# Patient Record
Sex: Female | Born: 1990 | Race: Black or African American | Hispanic: No | Marital: Single | Smoking: Light tobacco smoker
Health system: Southern US, Community
[De-identification: ages and names within clinical notes are randomized; demographics above are authoritative.]

## PROBLEM LIST (undated history)

## (undated) DIAGNOSIS — I10 Essential (primary) hypertension: Secondary | ICD-10-CM

---
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
## 2009-01-30 ENCOUNTER — Inpatient Hospital Stay (HOSPITAL_COMMUNITY): Admission: AD | Admit: 2009-01-30 | Discharge: 2009-02-01 | Payer: Self-pay | Admitting: Obstetrics and Gynecology

## 2009-07-15 ENCOUNTER — Emergency Department (HOSPITAL_COMMUNITY): Admission: EM | Admit: 2009-07-15 | Discharge: 2009-07-15 | Payer: Self-pay | Admitting: Family Medicine

## 2010-01-17 ENCOUNTER — Emergency Department (HOSPITAL_COMMUNITY): Admission: EM | Admit: 2010-01-17 | Discharge: 2010-01-17 | Payer: Self-pay | Admitting: Family Medicine

## 2010-03-23 ENCOUNTER — Emergency Department (HOSPITAL_COMMUNITY): Admission: EM | Admit: 2010-03-23 | Discharge: 2010-03-23 | Payer: Self-pay | Admitting: Emergency Medicine

## 2010-06-25 ENCOUNTER — Emergency Department (HOSPITAL_COMMUNITY)
Admission: EM | Admit: 2010-06-25 | Discharge: 2010-06-25 | Payer: Self-pay | Source: Home / Self Care | Admitting: Emergency Medicine

## 2010-08-11 LAB — URINALYSIS, ROUTINE W REFLEX MICROSCOPIC
Bilirubin Urine: NEGATIVE
Glucose, UA: NEGATIVE mg/dL
Hgb urine dipstick: NEGATIVE
Specific Gravity, Urine: 1.033 — ABNORMAL HIGH (ref 1.005–1.030)
Urobilinogen, UA: 1 mg/dL (ref 0.0–1.0)
pH: 8 (ref 5.0–8.0)

## 2010-08-11 LAB — URINE MICROSCOPIC-ADD ON

## 2010-08-11 LAB — URINE CULTURE: Colony Count: NO GROWTH

## 2010-08-11 LAB — POCT PREGNANCY, URINE: Preg Test, Ur: NEGATIVE

## 2010-08-13 LAB — POCT PREGNANCY, URINE
Preg Test, Ur: POSITIVE
Preg Test, Ur: POSITIVE

## 2010-09-03 LAB — CBC
HCT: 35.9 % — ABNORMAL LOW (ref 36.0–46.0)
MCHC: 33.5 g/dL (ref 30.0–36.0)
Platelets: 199 10*3/uL (ref 150–400)
RBC: 3.84 MIL/uL — ABNORMAL LOW (ref 3.87–5.11)
RBC: 4.14 MIL/uL (ref 3.87–5.11)
WBC: 11.3 10*3/uL — ABNORMAL HIGH (ref 4.0–10.5)

## 2010-09-03 LAB — RPR: RPR Ser Ql: NONREACTIVE

## 2010-09-09 LAB — URINALYSIS, ROUTINE W REFLEX MICROSCOPIC
Nitrite: NEGATIVE
Specific Gravity, Urine: 1.02 (ref 1.005–1.030)
pH: 7.5 (ref 5.0–8.0)

## 2010-09-09 LAB — URINE MICROSCOPIC-ADD ON

## 2010-09-23 ENCOUNTER — Emergency Department (HOSPITAL_COMMUNITY)
Admission: EM | Admit: 2010-09-23 | Discharge: 2010-09-23 | Disposition: A | Discharge status: Home / Self Care | Payer: Self-pay | Attending: Emergency Medicine | Admitting: Emergency Medicine

## 2010-09-23 ENCOUNTER — Emergency Department (HOSPITAL_COMMUNITY): Payer: Self-pay

## 2010-09-23 DIAGNOSIS — M79609 Pain in unspecified limb: Secondary | ICD-10-CM | POA: Insufficient documentation

## 2010-09-23 DIAGNOSIS — S6390XA Sprain of unspecified part of unspecified wrist and hand, initial encounter: Secondary | ICD-10-CM | POA: Insufficient documentation

## 2010-11-12 ENCOUNTER — Inpatient Hospital Stay (INDEPENDENT_AMBULATORY_CARE_PROVIDER_SITE_OTHER)
Admission: RE | Admit: 2010-11-12 | Discharge: 2010-11-12 | Disposition: A | Discharge status: Home / Self Care | Payer: Self-pay | Source: Ambulatory Visit | Attending: Family Medicine | Admitting: Family Medicine

## 2010-11-12 DIAGNOSIS — H109 Unspecified conjunctivitis: Secondary | ICD-10-CM

## 2010-11-12 DIAGNOSIS — N912 Amenorrhea, unspecified: Secondary | ICD-10-CM

## 2010-12-02 ENCOUNTER — Emergency Department (HOSPITAL_COMMUNITY)
Admission: EM | Admit: 2010-12-02 | Discharge: 2010-12-02 | Disposition: A | Discharge status: Home / Self Care | Payer: Self-pay | Attending: Emergency Medicine | Admitting: Emergency Medicine

## 2010-12-02 DIAGNOSIS — M79609 Pain in unspecified limb: Secondary | ICD-10-CM | POA: Insufficient documentation

## 2010-12-07 ENCOUNTER — Emergency Department (HOSPITAL_COMMUNITY)
Admission: EM | Admit: 2010-12-07 | Discharge: 2010-12-07 | Disposition: A | Discharge status: Home / Self Care | Payer: Self-pay | Attending: Emergency Medicine | Admitting: Emergency Medicine

## 2010-12-07 DIAGNOSIS — J343 Hypertrophy of nasal turbinates: Secondary | ICD-10-CM | POA: Insufficient documentation

## 2010-12-07 DIAGNOSIS — J309 Allergic rhinitis, unspecified: Secondary | ICD-10-CM | POA: Insufficient documentation

## 2010-12-07 DIAGNOSIS — R040 Epistaxis: Secondary | ICD-10-CM | POA: Insufficient documentation

## 2010-12-30 ENCOUNTER — Emergency Department (HOSPITAL_COMMUNITY)
Admission: EM | Admit: 2010-12-30 | Discharge: 2010-12-31 | Discharge status: Against Medical Advice | Payer: Self-pay | Attending: Emergency Medicine | Admitting: Emergency Medicine

## 2010-12-30 DIAGNOSIS — R509 Fever, unspecified: Secondary | ICD-10-CM | POA: Insufficient documentation

## 2010-12-30 DIAGNOSIS — J029 Acute pharyngitis, unspecified: Secondary | ICD-10-CM | POA: Insufficient documentation

## 2010-12-31 ENCOUNTER — Emergency Department (HOSPITAL_COMMUNITY)
Admission: EM | Admit: 2010-12-31 | Discharge: 2010-12-31 | Disposition: A | Discharge status: Home / Self Care | Payer: Self-pay | Attending: Emergency Medicine | Admitting: Emergency Medicine

## 2010-12-31 DIAGNOSIS — R05 Cough: Secondary | ICD-10-CM | POA: Insufficient documentation

## 2010-12-31 DIAGNOSIS — R509 Fever, unspecified: Secondary | ICD-10-CM | POA: Insufficient documentation

## 2010-12-31 DIAGNOSIS — J3489 Other specified disorders of nose and nasal sinuses: Secondary | ICD-10-CM | POA: Insufficient documentation

## 2010-12-31 DIAGNOSIS — J029 Acute pharyngitis, unspecified: Secondary | ICD-10-CM | POA: Insufficient documentation

## 2010-12-31 DIAGNOSIS — R059 Cough, unspecified: Secondary | ICD-10-CM | POA: Insufficient documentation

## 2011-02-18 LAB — BASIC METABOLIC PANEL
BUN: 15
CO2: 26
Chloride: 100
Creatinine, Ser: 0.8
Glucose, Bld: 107 — ABNORMAL HIGH

## 2011-05-05 ENCOUNTER — Emergency Department (HOSPITAL_COMMUNITY): Payer: Self-pay

## 2011-05-05 ENCOUNTER — Encounter: Payer: Self-pay | Admitting: Emergency Medicine

## 2011-05-05 ENCOUNTER — Emergency Department (HOSPITAL_COMMUNITY)
Admission: EM | Admit: 2011-05-05 | Discharge: 2011-05-06 | Disposition: A | Discharge status: Home / Self Care | Payer: Self-pay | Attending: Emergency Medicine | Admitting: Emergency Medicine

## 2011-05-05 DIAGNOSIS — IMO0001 Reserved for inherently not codable concepts without codable children: Secondary | ICD-10-CM | POA: Insufficient documentation

## 2011-05-05 DIAGNOSIS — R059 Cough, unspecified: Secondary | ICD-10-CM | POA: Insufficient documentation

## 2011-05-05 DIAGNOSIS — R05 Cough: Secondary | ICD-10-CM | POA: Insufficient documentation

## 2011-05-05 DIAGNOSIS — R509 Fever, unspecified: Secondary | ICD-10-CM | POA: Insufficient documentation

## 2011-05-05 DIAGNOSIS — R51 Headache: Secondary | ICD-10-CM | POA: Insufficient documentation

## 2011-05-05 DIAGNOSIS — I10 Essential (primary) hypertension: Secondary | ICD-10-CM | POA: Insufficient documentation

## 2011-05-05 DIAGNOSIS — J111 Influenza due to unidentified influenza virus with other respiratory manifestations: Secondary | ICD-10-CM | POA: Insufficient documentation

## 2011-05-05 HISTORY — DX: Essential (primary) hypertension: I10

## 2011-05-05 MED ORDER — OSELTAMIVIR PHOSPHATE 75 MG PO CAPS: 75.0000 mg | ORAL_CAPSULE | Freq: Two times a day (BID) | ORAL | Status: AC

## 2011-05-05 NOTE — ED Provider Notes (Signed)
History     CSN: [id number] Arrival date & time: 05/05/2011  8:46 PM   First MD Initiated Contact with Patient 05/05/11 2244      Chief Complaint  Patient presents with  . 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  She has not been immunized against flu.  This year.  She has a 2 day history of myalgias, cough, headache, mild sore throat.  She is taking over-the-counter ibuprofen with some relief of her discomfort  Patient is a 20 y.o. female presenting with cough. The history is provided by the patient.  Cough This is a new problem. The current episode started 2 days ago. The problem occurs constantly. The cough is non-productive. The maximum temperature recorded prior to her arrival was 101 to 101.9 F. Associated symptoms include chills, headaches and myalgias. Pertinent negatives include no shortness of breath. She has tried nothing for the symptoms.    Past Medical History  Diagnosis Date  . Hypertension     History reviewed. No pertinent past surgical history.  No family history on file.  History  Substance Use Topics  . Smoking status: Never Smoker   . Smokeless tobacco: Not on file  . Alcohol Use: No    OB History    Grav Para Term Preterm Abortions TAB SAB Ect Mult Living                  Review of Systems  Constitutional: Positive for chills.  Eyes: Negative.   Respiratory: Positive for cough. Negative for shortness of breath.   Cardiovascular: Negative.   Gastrointestinal: Negative.   Genitourinary: Negative.   Musculoskeletal: Positive for myalgias.  Skin: Negative.   Neurological: Positive for headaches.  Hematological: Negative.   Psychiatric/Behavioral: Negative.     Allergies  Review of patient's allergies indicates no known allergies.  Home Medications   Current Outpatient Rx  Name  Route Sig Dispense Refill  . OSELTAMIVIR PHOSPHATE 75 MG PO CAPS Oral Take 1 capsule (75 mg total) by mouth every 12 (twelve) hours. 10 capsule 0    LMP 04/27/2011  Physical Exam  Constitutional: She is oriented to person, place, and time.  Eyes: Pupils are equal, round, and reactive to light.  Neck: Normal range of motion.  Cardiovascular: Normal rate.   Pulmonary/Chest: Effort normal and breath sounds normal. She has no wheezes.  Abdominal: Soft.  Neurological: She is alert and oriented to person, place, and time.  Skin: Skin is warm and dry.    ED Course  Procedures (including critical care time)  Labs Reviewed - No data to display Dg Chest 2 View  05/05/2011  *RADIOLOGY REPORT*  Clinical Data: Cough and fever.  CHEST - 2 VIEW  Comparison: 09/30/2007.  Findings: The heart size and mediastinal contours are normal. The lungs are clear. There is no pleural effusion or pneumothorax. No acute osseous findings are identified.  IMPRESSION: No active cardiopulmonary process.  Original Report Authenticated By: Gerrianne Scale, M.D.     1. 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

## 2011-05-05 NOTE — ED Notes (Signed)
PT. REPORTS PRODUCTIVE COUGH WITH FEVER AND CHEST CONGESTION .

## 2011-05-06 NOTE — ED Provider Notes (Signed)
Medical screening examination/treatment/procedure(s) were performed by non-physician practitioner and as supervising physician I was immediately available for consultation/collaboration.  Hurman Horn, MD 05/06/11 1754

## 2012-04-24 IMAGING — CR DG CHEST 2V
2 series · 2 of 2 positions shown · non-contrast
Comparison: 09/30/2007.

CLINICAL DATA: Cough and fever.

CHEST - 2 VIEW

[w chest pa]
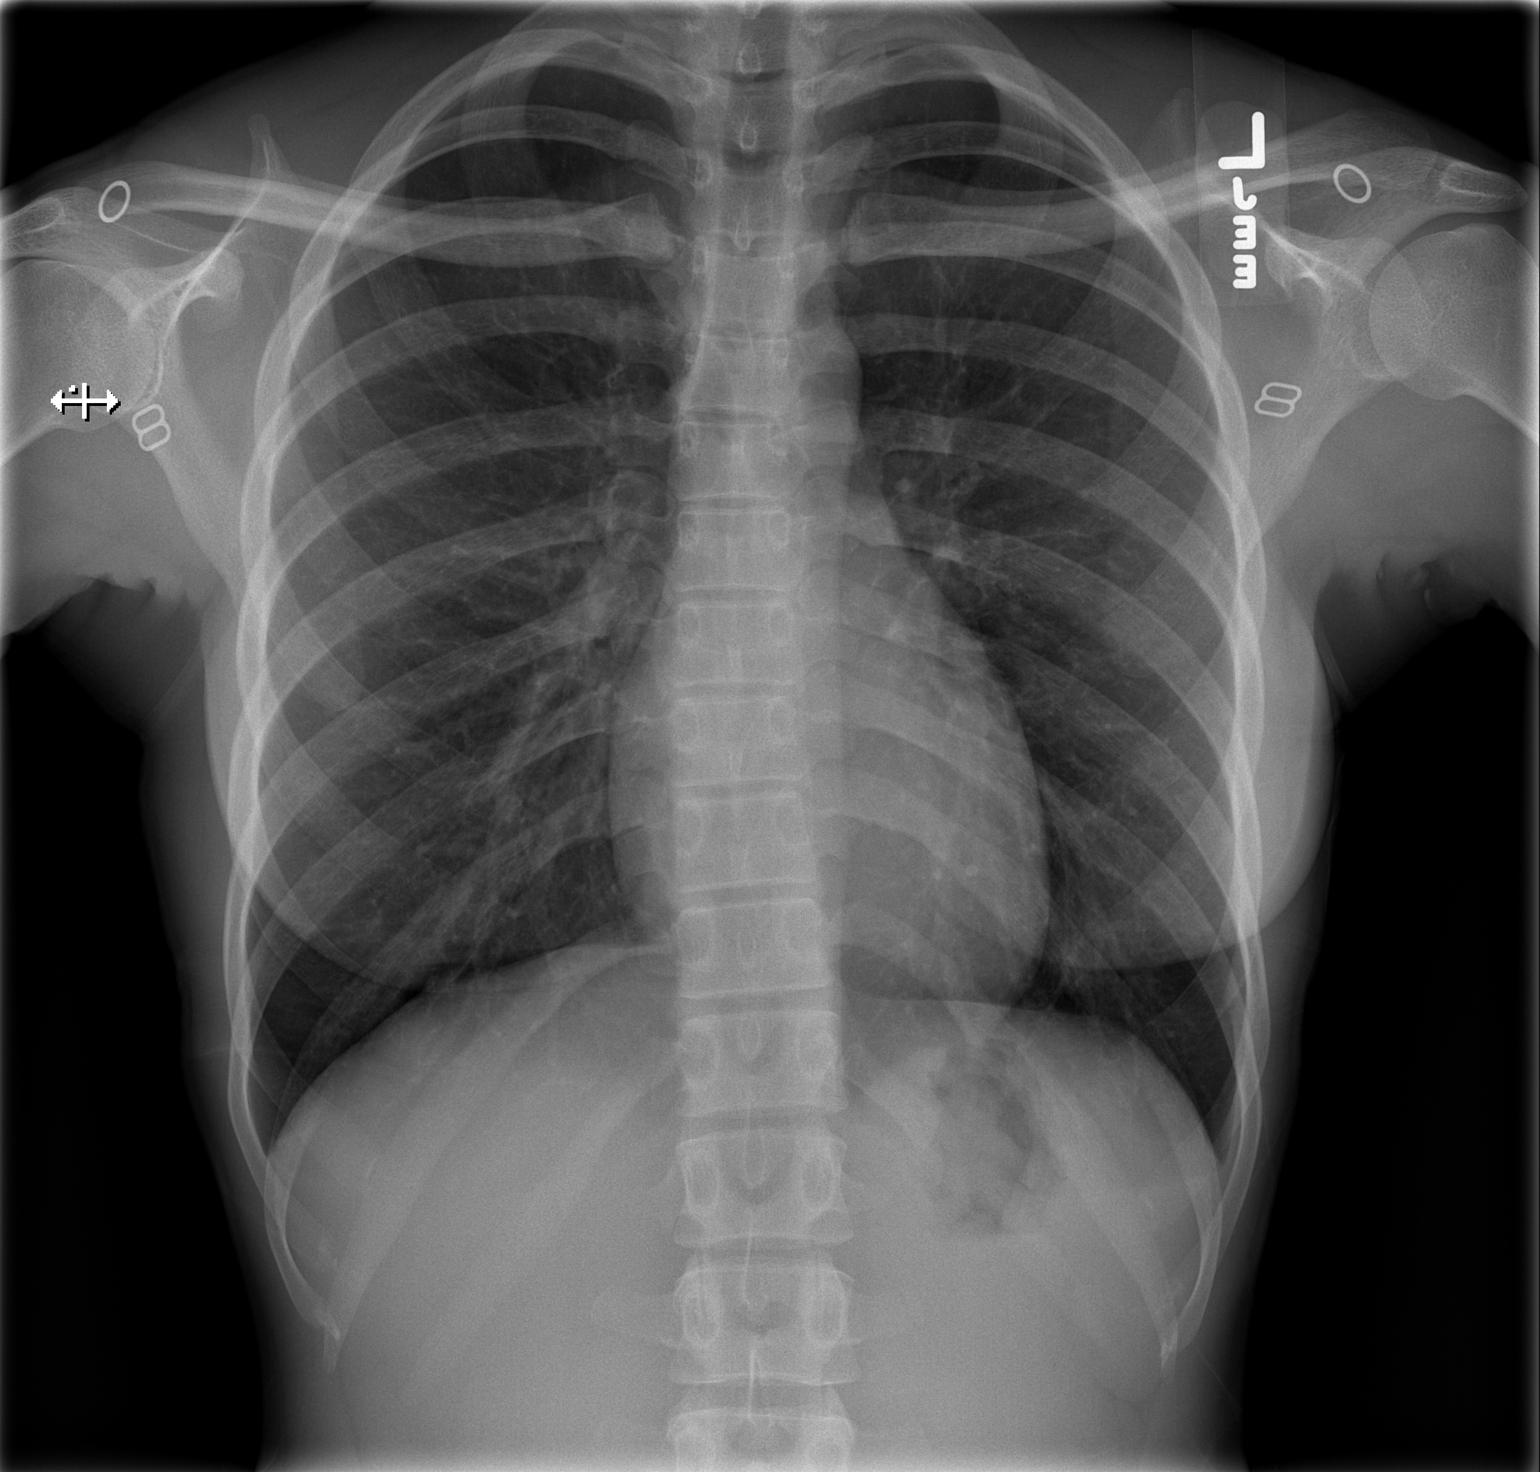

[w chest lat]
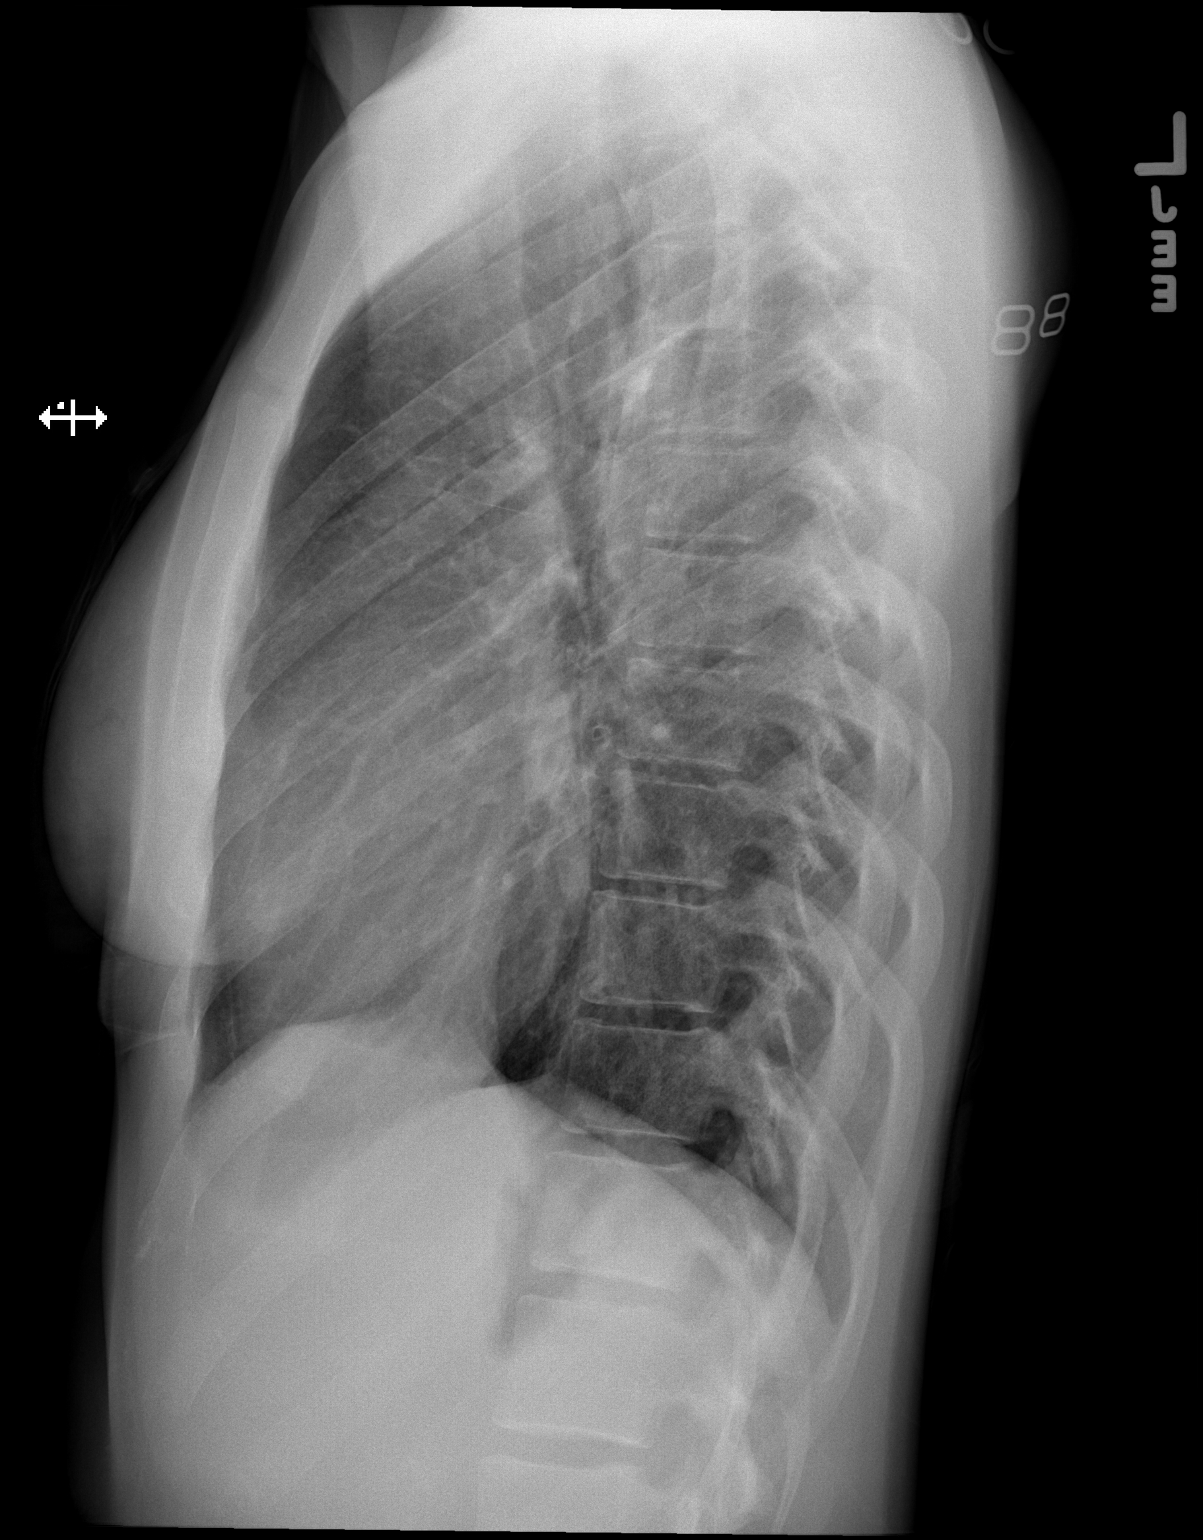

[2 of 2 positions shown; findings below may reference images not displayed]

FINDINGS: The heart size and mediastinal contours are normal. The
lungs are clear. There is no pleural effusion or pneumothorax. No
acute osseous findings are identified.
IMPRESSION: No active cardiopulmonary process.

## 2012-05-10 ENCOUNTER — Emergency Department (INDEPENDENT_AMBULATORY_CARE_PROVIDER_SITE_OTHER)
Admission: EM | Admit: 2012-05-10 | Discharge: 2012-05-10 | Disposition: A | Discharge status: Home / Self Care | Payer: Self-pay | Source: Home / Self Care | Attending: Emergency Medicine | Admitting: Emergency Medicine

## 2012-05-10 ENCOUNTER — Encounter (HOSPITAL_COMMUNITY): Payer: Self-pay | Admitting: *Deleted

## 2012-05-10 DIAGNOSIS — B9789 Other viral agents as the cause of diseases classified elsewhere: Secondary | ICD-10-CM

## 2012-05-10 DIAGNOSIS — B349 Viral infection, unspecified: Secondary | ICD-10-CM

## 2012-05-10 MED ORDER — IBUPROFEN 600 MG PO TABS: 600.0000 mg | ORAL_TABLET | Freq: Four times a day (QID) | ORAL | Status: DC | PRN

## 2012-05-10 NOTE — ED Provider Notes (Signed)
Medical screening examination/treatment/procedure(s) were performed by non-physician practitioner and as supervising physician I was immediately available for consultation/collaboration.  Lorilynn Lehr, M.D.   Johny Pitstick C Amariyana Heacox, MD 05/10/12 2249 
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
## 2012-05-10 NOTE — ED Provider Notes (Signed)
History     CSN: [id number]  Arrival date & time 05/10/12  1324   First MD Initiated Contact with Patient 05/10/12 1448      Chief Complaint  Patient presents with  . Sore Throat    (Consider location/radiation/quality/duration/timing/severity/associated sxs/prior treatment) Patient is a 21 y.o. female presenting with pharyngitis. The history is provided by the patient. No language interpreter was used.  Sore Throat This is a new problem. The current episode started yesterday. The problem occurs constantly. The problem has been gradually worsening. Pertinent negatives include no chest pain and no shortness of breath. Nothing aggravates the symptoms. Nothing relieves the symptoms. She has tried acetaminophen for the symptoms. The treatment provided mild relief.  Pt reports she has had a cough sorethroat and fever.   Pt reports her child had the same  Past Medical History  Diagnosis Date  . Hypertension     History reviewed. No pertinent past surgical history.  No family history on file.  History  Substance Use Topics  . Smoking status: Never Smoker   . Smokeless tobacco: Not on file  . Alcohol Use: No    OB History    Grav Para Term Preterm Abortions TAB SAB Ect Mult Living                  Review of Systems  HENT: Positive for sore throat.   Respiratory: Negative for shortness of breath.   Cardiovascular: Negative for chest pain.  All other systems reviewed and are negative.    Allergies  Review of patient's allergies indicates no known allergies.  Home Medications   Current Outpatient Rx  Name  Route  Sig  Dispense  Refill  . BENADRYL PO   Oral   Take by mouth.           BP 114/76  Pulse 103  Temp 98.9 F (37.2 C) (Oral)  Resp 16  SpO2 100%  Physical Exam  Nursing note and vitals reviewed. Constitutional: She appears well-developed and well-nourished.  HENT:  Head: Normocephalic and atraumatic.  Right Ear: External ear normal.  Left Ear:  External ear normal.  Nose: Nose normal.  Mouth/Throat: Oropharynx is clear and moist.  Eyes: Conjunctivae normal and EOM are normal. Pupils are equal, round, and reactive to light.  Neck: Normal range of motion. Neck supple.  Cardiovascular: Normal rate.   Pulmonary/Chest: Effort normal and breath sounds normal.  Abdominal: Soft. Bowel sounds are normal.  Musculoskeletal: Normal range of motion.  Neurological: She is alert.  Skin: Skin is warm.    ED Course  Procedures (including critical care time)  Labs Reviewed - No data to display No results found.   No diagnosis found.    MDM  I suspect viral illness,  I advised ibuprofen,  Encouraged fluids,  I advised return if symptoms worsen or change.       Lonia Skinner [place], [place] 05/10/12 1520

## 2013-05-15 ENCOUNTER — Emergency Department (HOSPITAL_BASED_OUTPATIENT_CLINIC_OR_DEPARTMENT_OTHER)
Admission: EM | Admit: 2013-05-15 | Discharge: 2013-05-15 | Disposition: A | Discharge status: Home / Self Care | Payer: Self-pay | Attending: Emergency Medicine | Admitting: Emergency Medicine

## 2013-05-15 ENCOUNTER — Encounter (HOSPITAL_BASED_OUTPATIENT_CLINIC_OR_DEPARTMENT_OTHER): Payer: Self-pay | Admitting: Emergency Medicine

## 2013-05-15 ENCOUNTER — Emergency Department (HOSPITAL_BASED_OUTPATIENT_CLINIC_OR_DEPARTMENT_OTHER): Payer: Self-pay

## 2013-05-15 DIAGNOSIS — F17200 Nicotine dependence, unspecified, uncomplicated: Secondary | ICD-10-CM | POA: Insufficient documentation

## 2013-05-15 DIAGNOSIS — R1013 Epigastric pain: Secondary | ICD-10-CM | POA: Insufficient documentation

## 2013-05-15 DIAGNOSIS — Z3202 Encounter for pregnancy test, result negative: Secondary | ICD-10-CM | POA: Insufficient documentation

## 2013-05-15 LAB — URINALYSIS, ROUTINE W REFLEX MICROSCOPIC
Bilirubin Urine: NEGATIVE
Glucose, UA: NEGATIVE mg/dL
Ketones, ur: 15 mg/dL — AB
Leukocytes, UA: NEGATIVE
Nitrite: NEGATIVE
Specific Gravity, Urine: 1.027 (ref 1.005–1.030)
Urobilinogen, UA: 1 mg/dL (ref 0.0–1.0)
pH: 8 (ref 5.0–8.0)

## 2013-05-15 LAB — COMPREHENSIVE METABOLIC PANEL
ALT: 13 U/L (ref 0–35)
AST: 17 U/L (ref 0–37)
Albumin: 4 g/dL (ref 3.5–5.2)
Alkaline Phosphatase: 54 U/L (ref 39–117)
BUN: 12 mg/dL (ref 6–23)
Chloride: 103 mEq/L (ref 96–112)
GFR calc Af Amer: >90 mL/min (ref >90)
Potassium: 3.7 mEq/L (ref 3.5–5.1)
Sodium: 141 mEq/L (ref 135–145)
Total Bilirubin: 0.2 mg/dL — ABNORMAL LOW (ref 0.3–1.2)
Total Protein: 6.9 g/dL (ref 6.0–8.3)

## 2013-05-15 LAB — CBC WITH DIFFERENTIAL/PLATELET
Basophils Absolute: 0 10*3/uL (ref 0.0–0.1)
Basophils Relative: 0 % (ref 0–1)
Eosinophils Absolute: 0.1 10*3/uL (ref 0.0–0.7)
Hemoglobin: 12.8 g/dL (ref 12.0–15.0)
MCH: 31.6 pg (ref 26.0–34.0)
MCHC: 34.2 g/dL (ref 30.0–36.0)
Monocytes Relative: 7 % (ref 3–12)
Neutro Abs: 3.2 10*3/uL (ref 1.7–7.7)
Neutrophils Relative %: 51 % (ref 43–77)
Platelets: 245 10*3/uL (ref 150–400)
RDW: 12.4 % (ref 11.5–15.5)

## 2013-05-15 LAB — LIPASE, BLOOD: Lipase: 30 U/L (ref 11–59)

## 2013-05-15 MED ORDER — GI COCKTAIL ~~LOC~~
30.0000 mL | Freq: Once | ORAL | Status: AC
  Administered 2013-05-15: 30 mL via ORAL
  Filled 2013-05-15: qty 30

## 2013-05-15 NOTE — ED Provider Notes (Signed)
CSN: [id number]     Arrival date & time 05/15/13  1441 History   First MD Initiated Contact with Patient 05/15/13 1450     Chief Complaint  Patient presents with  . Abdominal Pain   (Consider location/radiation/quality/duration/timing/severity/associated sxs/prior Treatment) HPI Comments: Patient is a 22 year old female otherwise healthy who presents with complaints of abdominal pain. She states that the symptoms began 4 years ago while she was pregnant and has been occurring intermittently since that time. She saw physician one year ago and had some tests perform and was told it was likely gastritis. She was started on Prilosec which she took for a month but did not notice much difference. The symptoms have been returning over the past few days.she denies fevers or chills. She denies any relation to food or drink. She does state that it seems to be worse at night. She denies any urinary complaints. Her last period was one month ago and was normal.   Patient is a 22 y.o. female presenting with abdominal pain. The history is provided by the patient.  Abdominal Pain Pain location:  Epigastric Pain quality: cramping   Pain radiates to:  Does not radiate Pain severity:  Moderate Onset quality:  Gradual Duration: 4 years. Timing:  Intermittent Progression:  Worsening   History reviewed. No pertinent past medical history. History reviewed. No pertinent past surgical history. No family history on file. History  Substance Use Topics  . Smoking status: Light Tobacco Smoker  . Smokeless tobacco: Not on file  . Alcohol Use: No   OB History   Grav Para Term Preterm Abortions TAB SAB Ect Mult Living                 Review of Systems  Gastrointestinal: Positive for abdominal pain.  All other systems reviewed and are negative.    Allergies  Review of patient's allergies indicates no known allergies.  Home Medications  No current outpatient prescriptions on file. BP 119/68  Pulse 69   Temp(Src) 98.4 F (36.9 C) (Oral)  Resp 16  SpO2 100%  LMP 04/23/2013 Physical Exam  Nursing note and vitals reviewed. Constitutional: She is oriented to person, place, and time. She appears well-developed and well-nourished. No distress.  HENT:  Head: Normocephalic and atraumatic.  Neck: Normal range of motion. Neck supple.  Cardiovascular: Normal rate and regular rhythm.  Exam reveals no gallop and no friction rub.   No murmur heard. Pulmonary/Chest: Effort normal and breath sounds normal. No respiratory distress. She has no wheezes.  Abdominal: Soft. Bowel sounds are normal. She exhibits no distension. There is tenderness.  There is mild tenderness to palpation in the epigastric region. There is no rebound and no guarding. Bowel sounds are present the  Musculoskeletal: Normal range of motion.  Neurological: She is alert and oriented to person, place, and time.  Skin: Skin is warm and dry. She is not diaphoretic.    ED Course  Procedures (including critical care time) Labs Review Labs Reviewed  CBC WITH DIFFERENTIAL  COMPREHENSIVE METABOLIC PANEL  LIPASE, BLOOD  URINALYSIS, ROUTINE W REFLEX MICROSCOPIC  PREGNANCY, URINE   Imaging Review No results found.    MDM  No diagnosis found. Patient is a 22 year old female presents with epigastric pain that she states has been intermittent for 4 years. Her laboratory studies are unremarkable an examination of her abdomen is benign. I suspect her symptoms either related to gastritis or acid reflux, or possibly gallbladder issues. My plans were to obtain an ultrasound  however there is not an ultrasound tech today to do this. She states that she is in a hurry to get her child from daycare he would like to return tomorrow to have this study done. I will make arrangements for this. In the meantime I will recommend that she take Prilosec 20 mg twice daily and will provide followup information for gastroenterology if her symptoms are not  explained by the ultrasound so that she can followup.    Geoffery Lyons, MD 05/15/13 [phone]

## 2013-05-15 NOTE — ED Notes (Signed)
Reports intermittent upper abdominal pain x one year.  Reoccurrence yesterday, here for evaluation d/t concern for ulcers.  Tried antacid without relief.

## 2013-05-16 ENCOUNTER — Ambulatory Visit (HOSPITAL_BASED_OUTPATIENT_CLINIC_OR_DEPARTMENT_OTHER): Admit: 2013-05-16 | Payer: Self-pay

## 2014-01-13 ENCOUNTER — Emergency Department (HOSPITAL_COMMUNITY)
Admission: EM | Admit: 2014-01-13 | Discharge: 2014-01-13 | Disposition: A | Discharge status: Home / Self Care | Payer: No Typology Code available for payment source | Attending: Emergency Medicine | Admitting: Emergency Medicine

## 2014-01-13 ENCOUNTER — Encounter (HOSPITAL_COMMUNITY): Payer: Self-pay | Admitting: Emergency Medicine

## 2014-01-13 ENCOUNTER — Emergency Department (HOSPITAL_COMMUNITY): Payer: No Typology Code available for payment source

## 2014-01-13 DIAGNOSIS — S161XXA Strain of muscle, fascia and tendon at neck level, initial encounter: Secondary | ICD-10-CM

## 2014-01-13 DIAGNOSIS — F17200 Nicotine dependence, unspecified, uncomplicated: Secondary | ICD-10-CM | POA: Insufficient documentation

## 2014-01-13 DIAGNOSIS — Z792 Long term (current) use of antibiotics: Secondary | ICD-10-CM | POA: Insufficient documentation

## 2014-01-13 DIAGNOSIS — S199XXA Unspecified injury of neck, initial encounter: Secondary | ICD-10-CM

## 2014-01-13 DIAGNOSIS — S0993XA Unspecified injury of face, initial encounter: Secondary | ICD-10-CM | POA: Insufficient documentation

## 2014-01-13 DIAGNOSIS — S39012A Strain of muscle, fascia and tendon of lower back, initial encounter: Secondary | ICD-10-CM

## 2014-01-13 DIAGNOSIS — Y92410 Unspecified street and highway as the place of occurrence of the external cause: Secondary | ICD-10-CM | POA: Insufficient documentation

## 2014-01-13 DIAGNOSIS — S139XXA Sprain of joints and ligaments of unspecified parts of neck, initial encounter: Secondary | ICD-10-CM | POA: Insufficient documentation

## 2014-01-13 DIAGNOSIS — Y9389 Activity, other specified: Secondary | ICD-10-CM | POA: Insufficient documentation

## 2014-01-13 DIAGNOSIS — S335XXA Sprain of ligaments of lumbar spine, initial encounter: Secondary | ICD-10-CM | POA: Insufficient documentation

## 2014-01-13 MED ORDER — HYDROCODONE-ACETAMINOPHEN 5-325 MG PO TABS: ORAL_TABLET | ORAL | Status: DC

## 2014-01-13 MED ORDER — HYDROCODONE-ACETAMINOPHEN 5-325 MG PO TABS
1.0000 | ORAL_TABLET | Freq: Once | ORAL | Status: AC
  Administered 2014-01-13: 1 via ORAL
  Filled 2014-01-13: qty 1
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
## 2014-01-13 NOTE — Discharge Instructions (Signed)
For pain control please take ibuprofen (also known as Motrin or Advil) 800mg  (this is normally 4 over the counter pills) 3 times a day  for 5 days. Take with food to minimize stomach irritation.  Take vicodin for breakthrough pain, do not drink alcohol, drive, care for children or do other critical tasks while taking vicodin.  Do not hesitate to return to the emergency room for any new, worsening or concerning symptoms.  Please obtain primary care using resource guide below. But the minute you were seen in the emergency room and that they will need to obtain records for further outpatient management.    Emergency Department Resource Guide 1) Find a Doctor and Pay Out of Pocket Although you won't have to find out who is covered by your insurance plan, it is a good idea to ask around and get recommendations. You will then need to call the office and see if the doctor you have chosen will accept you as a new patient and what types of options they offer for patients who are self-pay. Some doctors offer discounts or will set up payment plans for their patients who do not have insurance, but you will need to ask so you aren't surprised when you get to your appointment.  2) Contact Your Local Health Department Not all health departments have doctors that can see patients for sick visits, but many do, so it is worth a call to see if yours does. If you don't know where your local health department is, you can check in your phone book. The CDC also has a tool to help you locate your state's health department, and many state websites also have listings of all of their local health departments.  3) Find a Walk-in Clinic If your illness is not likely to be very severe or complicated, you may want to try a walk in clinic. These are popping up all over the country in pharmacies, drugstores, and shopping centers. They're usually staffed by nurse practitioners or physician assistants that have been trained to treat  common illnesses and complaints. They're usually fairly quick and inexpensive. However, if you have serious medical issues or chronic medical problems, these are probably not your best option.  No Primary Care Doctor: - Call Health Connect at  [phone] - they can help you locate a primary care doctor that  accepts your insurance, provides certain services, etc. - Physician Referral Service- [phone]  Chronic Pain Problems: Organization         Address  Phone   Notes  [facility]  [phone] Patients need to be referred by their primary care doctor.   Medication Assistance: Organization         Address  Phone   Notes  [facility] [address] [place], [place] [address] [phone] --Must be a resident of [facility] [place] -- Must have NO insurance coverage whatsoever (no Medicaid/ Medicare, etc.) -- The pt. MUST have a primary care doctor that directs their care regularly and follows them in the community   [facility]ist  [phone]   [employer]  [phone]    Agencies that provide inexpensive medical care: Organization         Address  Phone   Notes  [facility]  [phone]   [facility]    [phone]   [facility] [address] [place], [place] [address] [phone]  [facility] [address], [place] [phone]   [facility]anned [facility]    [phone]   [facility]    [phone]   [place] and [place] [address], [place] Phone:  [phone], Fax:  [phone] Hours of Operation:  9 am - 6 pm, M-F.  Also accepts Medicaid/Medicare and self-pay.  [facility] for [place], [address], [place] Phone: [phone], Fax: [phone]. Hours of Operation:  8:30 am - 5:30 pm, M-F.  Also accepts Medicaid and self-pay.  [facility] [place] [address] [place] Phone: [phone]   [place], [place], [place] [phone], Ext. [phone] Mondays & Thursdays: 7-9 AM.  First 15 patients are seen on a first come, first serve basis.    [place] Providers:  Organization         Address  Phone   Notes  [facility] [address], [place] [phone] Also accepts self-pay patients.  [facility] [address], [place]  [phone]   [place], [address], [place] [phone]   [facility] [address], [place] [phone]   Lucianne Lei [address], [place]   [phone] Only accepts [place] Access [place] patients after they have their name applied to their card.   Self-Pay (no insurance) in [facility]:  Organization         Address  Phone   Notes  Sickle Cell Patients, [facility] [place] [phone]   [facility] [place] [phone]   [facility] [place], [place], [place] [phone]   [facility]/Dr. Osei-Bonsu  [address] [place] or [address], [place] [phone] Phone number for both [place] and [place] locations is the same.  [facility] [address] [place] [phone]   [facility] [address], [place] or [address] [phone]   [facility] [address], [place] [phone], phone; [phone], fax Sees patients 1st and 3rd Saturday of every month.  Must not qualify for public or private insurance (i.e. Medicaid, Medicare, [place] Health Choice, Veterans' Benefits)  Household income should be no more than 200% of the  poverty level The clinic cannot treat you if you are pregnant or think you are pregnant  Sexually transmitted diseases are not treated at the clinic.    Dental Care: Organization         Address  Phone  Notes  [facility] Department of [facility] [place] [phone] Accepts children up to age 78 who are enrolled in [place] or [place]; pregnant women with a Medicaid card; and children who have applied for Medicaid or [place] Health Choice, but were declined, whose parents can pay a reduced fee at time of service.  [facility] Department of [facility]  [address], [place] [phone] Accepts children up to age 72 who are enrolled in [place] or [place]; pregnant women with a Medicaid card; and  children who have applied for Medicaid or [place] Health Choice, but were declined, whose parents can pay a reduced fee at time of service.  [place] [facility]  [place] [phone] Patients are seen by appointment only. Walk-ins are not accepted. [place] will see patients 49 years of age and older. Monday - Tuesday (8am-5pm) Most Wednesdays (8:30-5pm) $30 per visit, cash only  [facility] Adult Dental Access PROGRAM  [address], [facility] [phone] Patients are seen by appointment only. Walk-ins are not accepted. [place] will see patients 74 years of age and older. One Wednesday Evening (Monthly: Volunteer Based).  $30 per visit, cash only  [place]  [phone] for adults; Children under age 77, call Graduate Pediatric Dentistry at [phone]. Children aged 60-14, please call [phone] to request a pediatric application.  Dental services are provided in all areas of dental care including fillings, crowns and bridges, complete and partial dentures, implants, gum treatment, root canals, and extractions.  Preventive care is also provided. Treatment is provided to both adults and children. Patients are selected via a lottery and there is often a waiting list.   [facility] [address] [place]  [phone] [email or web address]   [facility] [address] [place], [place] [phone], Ext. [phone] Second and Fourth Thursday of each month, opens at 6:30 AM; Clinic ends at 9 AM.  Patients are seen on a first-come first-served basis, and a limited number are seen during each clinic.   [facility]  [address] Hillard Danker [place], [place] [phone]   Eligibility Requirements You must have lived in [place], [place], or [place] counties for at least the last three months.   You cannot be eligible for state or federal sponsored [employer], including [employer], [place], or [employer].   You generally cannot be eligible for healthcare insurance through your employer.    How to apply: Eligibility screenings are held every Tuesday and Wednesday afternoon from 1:00 pm until 4:00 pm. You do not need an appointment for the interview!  [facility] [address] [place], [place]   [place]  [place] Department  [place]  [phone]    Behavioral Health Resources in the Community: Intensive Outpatient Programs Organization         Address  Phone  Notes  [place] [address] [place], [place] [phone]   [facility] Outpatient [address] [place], [place]   ADS: [facility] [address], [place], [place]   [place] [address],  [place], [place] or [phone]   Substance Abuse Resources Organization         Address  Phone  Notes  [facility] Drug Services  [phone]   [place]  [phone]   [facility]   Chinita Pester  ([phone]   Residential & Outpatient Substance Abuse Program  [phone]   Psychological Services Organization         Address  Phone  Notes  [facility] [place]  [place]  [phone]   [place] [address] [place] or [phone]    Mobile Crisis Teams Organization  Address  Phone  Notes  °[facility], Mobile Crisis Care Unit  1-877-626-1772   °Assertive °Psychotherapeutic Services ° [address] [place], [place] [phone]   °Sharon DeEsch [address] °[place] [phone]   ° °Self-Help/Support Groups °Organization         Address  Phone             Notes  °[facility] [place] - variety of support groups  336- [phone] Call for more information  °Narcotics Anonymous (NA), [facility] [address], °[place]  2 meetings at this location  ° °Residential Treatment Programs °Organization         Address  Phone  Notes  °[facility] [address],    °[place]  1-866-801-8205   °[facility] ° [address], [place], [place] [phone]   °[facility] [address], [place] [phone] Admissions: 8am-3pm M-F  °[facility] [address]    °[place], [place] [phone]   °[facility] [address], [place], [place] [phone]   °[facility] [address]  °[place], [place] [phone]   °[facility] - Intensive Outpatient [address], [place], [place] [phone]   °[facility] [address]  °[place], [place] 1-877-615-2722 or [phone]   °[facility] ([facility]) [address] [place], [place] [phone] Accepts Medicaid  °[facility] [address]   °[place] 1-800-659-3381 Substance Abuse/Addiction Treatment  ° °[place] Behavioral Health Resources °Organization         Address  Phone  Notes  °[facility]  (888) [phone]   °Julie Brannon, PhD [address] [place], [place]   (336) [phone] or (336) 951-0000   °[facility] [place] [facility]   [address] °[place], [place] (336) [phone]   °[facility] [address], [place], [place] (336) [phone] Insurance/Medicaid/sponsorship through [facility]  °[facility] [address]                                    [place], [place] ([phone]) [phone] Therapy/tele-psych/case  °[facility] [address]  ° [place], [place] (336) [phone]    °Dr. Arfeen  ([phone]) [phone]   °[facility] [place]  [employer] [place] [facility] 1) [address],  °2) [address], [place] °3)  [address], [place] (336) [phone] °(336) [phone] ° °(336) [phone]   °[place] Child Abuse Hotline (336) 342-1394 or (336) [phone] (After Hours)    ° ° ° °

## 2014-01-13 NOTE — ED Provider Notes (Signed)
Medical screening examination/treatment/procedure(s) were performed by non-physician practitioner and as supervising physician I was immediately available for consultation/collaboration.   EKG Interpretation None       Raeford RazorStephen Dornell Grasmick, MD 01/13/14 2320
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
## 2014-12-09 ENCOUNTER — Encounter (HOSPITAL_COMMUNITY): Payer: Self-pay | Admitting: Emergency Medicine

## 2014-12-09 ENCOUNTER — Emergency Department (INDEPENDENT_AMBULATORY_CARE_PROVIDER_SITE_OTHER)
Admission: EM | Admit: 2014-12-09 | Discharge: 2014-12-09 | Disposition: A | Discharge status: Home / Self Care | Payer: Self-pay | Source: Home / Self Care | Attending: Family Medicine | Admitting: Family Medicine

## 2014-12-09 DIAGNOSIS — H6011 Cellulitis of right external ear: Secondary | ICD-10-CM

## 2014-12-09 MED ORDER — CEPHALEXIN 500 MG PO CAPS: 500.0000 mg | ORAL_CAPSULE | Freq: Four times a day (QID) | ORAL | Status: DC

## 2014-12-09 NOTE — ED Provider Notes (Signed)
CSN: [id number]     Arrival date & time 12/09/14  1451 History   First MD Initiated Contact with Patient 12/09/14 1537     No chief complaint on file.  (Consider location/radiation/quality/duration/timing/severity/associated sxs/prior Treatment) Patient is a 24 y.o. female presenting with ear pain. The history is provided by the patient.  Otalgia Location:  Right Behind ear:  Swelling Severity:  Mild Onset quality:  Gradual Duration:  2 days Chronicity:  New Context comment:  S/p piercings to right pinna , now with sts and pain to pinna superiorly, sl erythema, no drainage Relieved by:  None tried Worsened by:  Nothing tried Associated symptoms: rash     No past medical history on file. No past surgical history on file. No family history on file. History  Substance Use Topics  . Smoking status: Light Tobacco Smoker  . Smokeless tobacco: Not on file  . Alcohol Use: No   OB History    No data available     Review of Systems  Constitutional: Negative.   HENT: Positive for ear pain.   Skin: Positive for rash. Negative for pallor and wound.    Allergies  Review of patient's allergies indicates no known allergies.  Home Medications   Prior to Admission medications   Medication Sig Start Date End Date Taking? Authorizing Provider  amoxicillin (AMOXIL) 500 MG tablet Take 500 mg by mouth 2 (two) times daily.    Historical Provider, MD  cephALEXin (KEFLEX) 500 MG capsule Take 1 capsule (500 mg total) by mouth 4 (four) times daily. Take all of medicine and drink lots of fluids 12/09/14   Linna HoffJames D Clarann Helvey, MD  clarithromycin (BIAXIN) 250 MG tablet Take 250 mg by mouth 2 (two) times daily.    Historical Provider, MD  HYDROcodone-acetaminophen (NORCO/VICODIN) 5-325 MG per tablet Take 1-2 tablets by mouth every 6 hours as needed for pain. 01/13/14   Nicole Pisciotta, PA-C   BP 131/88 mmHg  Pulse 67  Temp(Src) 99.1 F (37.3 C) (Oral)  Resp 12  SpO2 98% Physical Exam    Constitutional: She appears well-developed and well-nourished. No distress.  HENT:  Head:    Left Ear: External ear normal.  Mouth/Throat: Oropharynx is clear and moist.  Nursing note and vitals reviewed.   ED Course  Procedures (including critical care time) Labs Review Labs Reviewed - No data to display  Imaging Review No results found.   MDM   1. Cellulitis of right pinna        Linna HoffJames D Mariaelena Cade, MD 12/09/14 1616

## 2014-12-09 NOTE — ED Notes (Signed)
Ear pain, onset yesterday, noted swelling

## 2014-12-09 NOTE — Discharge Instructions (Signed)
Warm compress 4 times a day when you take the antibiotic, take all of medicine, return as needed. °

## 2015-07-01 ENCOUNTER — Ambulatory Visit (INDEPENDENT_AMBULATORY_CARE_PROVIDER_SITE_OTHER): Payer: BLUE CROSS/BLUE SHIELD | Admitting: Family Medicine

## 2015-07-01 VITALS — BP 120/80 | HR 80 | Temp 98.5°F | Resp 20 | Ht 62.6 in | Wt 115.2 lb

## 2015-07-01 DIAGNOSIS — R1013 Epigastric pain: Secondary | ICD-10-CM | POA: Diagnosis not present

## 2015-07-01 MED ORDER — AMOXICILLIN 500 MG PO CAPS: 1000.0000 mg | ORAL_CAPSULE | Freq: Two times a day (BID) | ORAL | Status: AC

## 2015-07-01 MED ORDER — OMEPRAZOLE 20 MG PO CPDR: 20.0000 mg | DELAYED_RELEASE_CAPSULE | Freq: Two times a day (BID) | ORAL | Status: AC

## 2015-07-01 NOTE — Progress Notes (Signed)
   Subjective:    Patient ID: Rachael Meyer, female    DOB: [birth year], 25 y.o.   MRN: [id number] By signing my name below, I, Rachael Meyer, attest that this documentation has been prepared under the direction and in the presence of Elvina Sidle, MD.  Electronically Signed: Littie Meyer, Medical Scribe. 07/01/2015. 10:58 AM.  HPI HPI Comments: Rachael Meyer is a 25 y.o. female who presents to the [facility] complaining of gradual onset abdominal pain that started about a week ago. The pain is worse at night when she is working. The pain is improved for about 30 minutes after eating. She tried Pepto-Bismol and other OTC treatments but without relief. She states the pain is similar to when she was diagnosed with a H. Pylori infection. At that time, she was treated successfully with amoxicillin.  Patient works at [employer]. She has one child.  Review of Systems  Gastrointestinal: Positive for abdominal pain.       Objective:   Physical Exam CONSTITUTIONAL: Well developed/well nourished HEAD: Normocephalic/atraumatic EYES: EOM/PERRL ENMT: Mucous membranes moist NECK: supple no meningeal signs SPINE: entire spine nontender CV: S1/S2 noted, no murmurs/rubs/gallops noted LUNGS: Lungs are clear to auscultation bilaterally, no apparent distress ABDOMEN: Tender in the epigastrium with deep palpation. No rebound. No HSM. GU: no cva tenderness NEURO: Pt is awake/alert, moves all extremitiesx4 EXTREMITIES: pulses normal, full ROM SKIN: warm, color normal PSYCH: no abnormalities of mood noted        Assessment & Plan:   This chart was scribed in my presence and reviewed by me personally.    ICD-9-CM ICD-10-CM   1. Abdominal pain, epigastric 789.06 R10.13 H. pylori breath test     amoxicillin (AMOXIL) 500 MG capsule     omeprazole (PRILOSEC) 20 MG capsule     Signed, Elvina Sidle, MD

## 2015-07-01 NOTE — Patient Instructions (Signed)
We will call you in the next couple days for results of this test area and if it's positive, we will prescribe another medication go with the amoxicillin and Prilosec   Helicobacter Pylori Antibodies Test WHY AM I HAVING THIS TEST? This is a blood test that looks for bacteria called Helicobacter pylori (H. pylori). H. pylori is a germ that can be found in the cells that line the stomach. Having high levels of H. pylori in your stomach puts you at risk for stomach ulcers and small bowel ulcers, long-term (chronic) inflammation of the lining of the stomach, or even ulcers that may occur in the canal that runs from the mouth to the stomach (esophagus). Presence of H. pylori can also increase your risk for stomach cancer if it is left untreated. Most people with H. pylori in their stomach bacteria have no symptoms. Your health care provider may ask you to have this test if you have symptoms of a stomach ulcer or small bowel ulcer, such as stomach pain before or after eating, heartburn, or nausea repeatedly after eating. WHAT KIND OF SAMPLE IS TAKEN? A blood sample is required for this test. It is usually collected by inserting a needle into a vein or sticking a finger with a small needle. HOW DO I PREPARE FOR THE TEST? There is no preparation required for this test. WHAT ARE THE REFERENCE RANGES? Reference ranges are considered healthy ranges established after testing a large group of healthy people. Reference ranges may vary among different people, labs, and hospitals. It is your responsibility to obtain your test results. Ask the lab or department performing the test when and how you will get your results. Your test results will be reported as positive, negative, or equivocal. Equivocal means that your results are neither positive nor negative. References ranges for these results are as follows:  Less than or equal to 30 units/mL. This is negative.  Greater than or equal to 40 units/mL. This is  positive.  30.01-39.99 units/mL. This is equivocal. WHAT DO THE RESULTS MEAN? Test results that are higher than normal may indicate numerous health conditions. These may include:  Short-term or long-term irritation of the stomach lining (gastritis).  Small bowel ulcer.  Stomach ulcer.  Stomach cancer. Talk with your health care provider to discuss your results, treatment options, and if necessary, the need for more tests. Talk with your health care provider if you have any questions about your results.   This information is not intended to replace advice given to you by your health care provider. Make sure you discuss any questions you have with your health care provider.   Document Released: 06/09/2004 Document Revised: 06/06/2014 Document Reviewed: 09/27/2013 Elsevier Interactive Patient Education [employer]

## 2015-07-02 LAB — H. PYLORI BREATH TEST: H. pylori Breath Test: DETECTED — AB

## 2015-07-03 ENCOUNTER — Other Ambulatory Visit: Payer: Self-pay | Admitting: Family Medicine

## 2015-07-03 MED ORDER — METRONIDAZOLE 500 MG PO TABS
500.0000 mg | ORAL_TABLET | Freq: Two times a day (BID) | ORAL | Status: AC
Start: 2015-07-03 — End: ?

## 2023-03-14 ENCOUNTER — Encounter (HOSPITAL_BASED_OUTPATIENT_CLINIC_OR_DEPARTMENT_OTHER): Payer: Self-pay | Admitting: Pediatrics

## 2023-03-14 ENCOUNTER — Emergency Department (HOSPITAL_BASED_OUTPATIENT_CLINIC_OR_DEPARTMENT_OTHER)
Admission: EM | Admit: 2023-03-14 | Discharge: 2023-03-14 | Discharge status: Against Medical Advice | Payer: Medicaid Other | Source: Home / Self Care

## 2023-03-14 ENCOUNTER — Emergency Department (HOSPITAL_BASED_OUTPATIENT_CLINIC_OR_DEPARTMENT_OTHER)
Admission: EM | Admit: 2023-03-14 | Discharge: 2023-03-14 | Disposition: A | Discharge status: Home / Self Care | Payer: Medicaid Other | Attending: Emergency Medicine | Admitting: Emergency Medicine

## 2023-03-14 ENCOUNTER — Other Ambulatory Visit: Payer: Self-pay

## 2023-03-14 ENCOUNTER — Emergency Department (HOSPITAL_BASED_OUTPATIENT_CLINIC_OR_DEPARTMENT_OTHER): Payer: Medicaid Other

## 2023-03-14 DIAGNOSIS — S161XXA Strain of muscle, fascia and tendon at neck level, initial encounter: Secondary | ICD-10-CM | POA: Insufficient documentation

## 2023-03-14 DIAGNOSIS — Y92410 Unspecified street and highway as the place of occurrence of the external cause: Secondary | ICD-10-CM | POA: Diagnosis not present

## 2023-03-14 DIAGNOSIS — S0990XA Unspecified injury of head, initial encounter: Secondary | ICD-10-CM | POA: Insufficient documentation

## 2023-03-14 DIAGNOSIS — R1011 Right upper quadrant pain: Secondary | ICD-10-CM | POA: Diagnosis not present

## 2023-03-14 DIAGNOSIS — S199XXA Unspecified injury of neck, initial encounter: Secondary | ICD-10-CM | POA: Diagnosis present

## 2023-03-14 DIAGNOSIS — S299XXA Unspecified injury of thorax, initial encounter: Secondary | ICD-10-CM | POA: Diagnosis not present

## 2023-03-14 DIAGNOSIS — M7918 Myalgia, other site: Secondary | ICD-10-CM

## 2023-03-14 LAB — BASIC METABOLIC PANEL
Anion gap: 8 (ref 5–15)
BUN: 18 mg/dL (ref 6–20)
CO2: 28 mmol/L (ref 22–32)
Calcium: 8.7 mg/dL — ABNORMAL LOW (ref 8.9–10.3)
Chloride: 104 mmol/L (ref 98–111)
Creatinine, Ser: 0.81 mg/dL (ref 0.44–1.00)
GFR, Estimated: >60 mL/min (ref >60)
Glucose, Bld: 167 mg/dL — ABNORMAL HIGH (ref 70–99)
Potassium: 3.7 mmol/L (ref 3.5–5.1)
Sodium: 140 mmol/L (ref 135–145)

## 2023-03-14 LAB — CBC WITH DIFFERENTIAL/PLATELET
Abs Immature Granulocytes: 0.04 10*3/uL (ref 0.00–0.07)
Basophils Absolute: 0 10*3/uL (ref 0.0–0.1)
Basophils Relative: 0 %
Eosinophils Absolute: 0.1 10*3/uL (ref 0.0–0.5)
Eosinophils Relative: 1 %
HCT: 35.1 % — ABNORMAL LOW (ref 36.0–46.0)
Hemoglobin: 11.9 g/dL — ABNORMAL LOW (ref 12.0–15.0)
Immature Granulocytes: 1 %
Lymphocytes Relative: 25 %
Lymphs Abs: 1.6 10*3/uL (ref 0.7–4.0)
MCH: 31.7 pg (ref 26.0–34.0)
MCHC: 33.9 g/dL (ref 30.0–36.0)
MCV: 93.6 fL (ref 80.0–100.0)
Monocytes Absolute: 0.3 10*3/uL (ref 0.1–1.0)
Monocytes Relative: 4 %
Neutro Abs: 4.6 10*3/uL (ref 1.7–7.7)
Neutrophils Relative %: 69 %
Platelets: 276 10*3/uL (ref 150–400)
RBC: 3.75 MIL/uL — ABNORMAL LOW (ref 3.87–5.11)
RDW: 12.6 % (ref 11.5–15.5)
WBC: 6.7 10*3/uL (ref 4.0–10.5)
nRBC: 0 % (ref 0.0–0.2)

## 2023-03-14 LAB — HCG, SERUM, QUALITATIVE: Preg, Serum: NEGATIVE

## 2023-03-14 MED ORDER — CYCLOBENZAPRINE HCL 10 MG PO TABS: 10.0000 mg | ORAL_TABLET | Freq: Two times a day (BID) | ORAL | 0 refills | Status: AC | PRN

## 2023-03-14 MED ORDER — IOHEXOL 300 MG/ML  SOLN
100.0000 mL | Freq: Once | INTRAMUSCULAR | Status: AC | PRN
  Administered 2023-03-14: 100 mL via INTRAVENOUS

## 2023-03-14 MED ORDER — ACETAMINOPHEN 500 MG PO TABS
1000.0000 mg | ORAL_TABLET | Freq: Once | ORAL | Status: AC
  Administered 2023-03-14: 1000 mg via ORAL
  Filled 2023-03-14: qty 2

## 2023-03-14 MED ORDER — FENTANYL CITRATE PF 50 MCG/ML IJ SOSY
50.0000 ug | PREFILLED_SYRINGE | Freq: Once | INTRAMUSCULAR | Status: AC
  Administered 2023-03-14: 50 ug via INTRAVENOUS
  Filled 2023-03-14: qty 1
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
## 2023-03-14 NOTE — ED Notes (Signed)
Patient transported to CT 
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
## 2023-05-19 ENCOUNTER — Other Ambulatory Visit: Payer: Self-pay | Admitting: Physician Assistant

## 2023-05-19 DIAGNOSIS — N6320 Unspecified lump in the left breast, unspecified quadrant: Secondary | ICD-10-CM

## 2023-06-09 ENCOUNTER — Other Ambulatory Visit: Payer: Medicaid Other

## 2023-06-26 ENCOUNTER — Other Ambulatory Visit: Payer: Self-pay | Admitting: Physician Assistant

## 2023-06-26 DIAGNOSIS — N6320 Unspecified lump in the left breast, unspecified quadrant: Secondary | ICD-10-CM

## 2023-06-27 ENCOUNTER — Other Ambulatory Visit: Payer: Self-pay | Admitting: Physician Assistant

## 2023-06-27 DIAGNOSIS — N6320 Unspecified lump in the left breast, unspecified quadrant: Secondary | ICD-10-CM

## 2023-06-30 ENCOUNTER — Other Ambulatory Visit: Payer: Self-pay | Admitting: Physician Assistant

## 2023-06-30 ENCOUNTER — Ambulatory Visit
Admission: RE | Admit: 2023-06-30 | Discharge: 2023-06-30 | Disposition: A | Discharge status: Home / Self Care | Payer: Medicaid Other | Source: Ambulatory Visit | Attending: Physician Assistant | Admitting: Physician Assistant

## 2023-06-30 DIAGNOSIS — N6320 Unspecified lump in the left breast, unspecified quadrant: Secondary | ICD-10-CM

## 2023-12-27 ENCOUNTER — Inpatient Hospital Stay: Admission: RE | Admit: 2023-12-27 | Payer: Medicaid Other | Source: Ambulatory Visit
